# Patient Record
Sex: Male | Born: 2005 | Race: White | Hispanic: No | Marital: Single | Smoking: Never smoker
Health system: Southern US, Community
[De-identification: ages and names within clinical notes are randomized; demographics above are authoritative.]

## PROBLEM LIST (undated history)

## (undated) DIAGNOSIS — R4689 Other symptoms and signs involving appearance and behavior: Secondary | ICD-10-CM

## (undated) DIAGNOSIS — F913 Oppositional defiant disorder: Secondary | ICD-10-CM

## (undated) DIAGNOSIS — J302 Other seasonal allergic rhinitis: Secondary | ICD-10-CM

## (undated) DIAGNOSIS — L509 Urticaria, unspecified: Secondary | ICD-10-CM

## (undated) DIAGNOSIS — IMO0002 Reserved for concepts with insufficient information to code with codable children: Secondary | ICD-10-CM

## (undated) DIAGNOSIS — F909 Attention-deficit hyperactivity disorder, unspecified type: Secondary | ICD-10-CM

## (undated) DIAGNOSIS — J309 Allergic rhinitis, unspecified: Secondary | ICD-10-CM

## (undated) DIAGNOSIS — F980 Enuresis not due to a substance or known physiological condition: Secondary | ICD-10-CM

## (undated) DIAGNOSIS — J45909 Unspecified asthma, uncomplicated: Secondary | ICD-10-CM

## (undated) DIAGNOSIS — T7840XA Allergy, unspecified, initial encounter: Secondary | ICD-10-CM

## (undated) HISTORY — DX: Other seasonal allergic rhinitis: J30.2

## (undated) HISTORY — DX: Other symptoms and signs involving appearance and behavior: R46.89

## (undated) HISTORY — DX: Attention-deficit hyperactivity disorder, unspecified type: F90.9

## (undated) HISTORY — DX: Oppositional defiant disorder: F91.3

## (undated) HISTORY — DX: Allergy, unspecified, initial encounter: T78.40XA

## (undated) HISTORY — DX: Unspecified asthma, uncomplicated: J45.909

## (undated) HISTORY — PX: NO PAST SURGERIES: SHX2092

## (undated) HISTORY — DX: Reserved for concepts with insufficient information to code with codable children: IMO0002

## (undated) HISTORY — DX: Enuresis not due to a substance or known physiological condition: F98.0

## (undated) HISTORY — DX: Urticaria, unspecified: L50.9

## (undated) HISTORY — DX: Allergic rhinitis, unspecified: J30.9

---
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
## 2024-05-26 ENCOUNTER — Emergency Department: Payer: Self-pay

## 2024-05-26 ENCOUNTER — Other Ambulatory Visit: Payer: Self-pay

## 2024-05-26 ENCOUNTER — Emergency Department
Admission: EM | Admit: 2024-05-26 | Discharge: 2024-05-26 | Disposition: A | Discharge status: Home / Self Care | Payer: Self-pay | Attending: Emergency Medicine | Admitting: Emergency Medicine

## 2024-05-26 ENCOUNTER — Encounter: Payer: Self-pay | Admitting: Emergency Medicine

## 2024-05-26 DIAGNOSIS — R55 Syncope and collapse: Secondary | ICD-10-CM | POA: Insufficient documentation

## 2024-05-26 DIAGNOSIS — R0789 Other chest pain: Secondary | ICD-10-CM | POA: Insufficient documentation

## 2024-05-26 DIAGNOSIS — R079 Chest pain, unspecified: Secondary | ICD-10-CM

## 2024-05-26 DIAGNOSIS — J45909 Unspecified asthma, uncomplicated: Secondary | ICD-10-CM | POA: Insufficient documentation

## 2024-05-26 DIAGNOSIS — R110 Nausea: Secondary | ICD-10-CM | POA: Insufficient documentation

## 2024-05-26 DIAGNOSIS — R42 Dizziness and giddiness: Secondary | ICD-10-CM | POA: Insufficient documentation

## 2024-05-26 LAB — BASIC METABOLIC PANEL WITH GFR
Anion gap: 14 (ref 5–15)
BUN: 11 mg/dL (ref 6–20)
CO2: 23 mmol/L (ref 22–32)
Calcium: 9.7 mg/dL (ref 8.9–10.3)
Chloride: 102 mmol/L (ref 98–111)
Creatinine, Ser: 0.78 mg/dL (ref 0.61–1.24)
GFR, Estimated: >60 mL/min (ref >60)
Glucose, Bld: 89 mg/dL (ref 70–99)
Potassium: 4 mmol/L (ref 3.5–5.1)
Sodium: 139 mmol/L (ref 135–145)

## 2024-05-26 LAB — CBC
HCT: 45.1 % (ref 39.0–52.0)
Hemoglobin: 14.8 g/dL (ref 13.0–17.0)
MCH: 29 pg (ref 26.0–34.0)
MCHC: 32.8 g/dL (ref 30.0–36.0)
MCV: 88.4 fL (ref 80.0–100.0)
Platelets: 222 K/uL (ref 150–400)
RBC: 5.1 MIL/uL (ref 4.22–5.81)
RDW: 12.3 % (ref 11.5–15.5)
WBC: 6.2 K/uL (ref 4.0–10.5)
nRBC: 0 % (ref 0.0–0.2)

## 2024-05-26 LAB — TROPONIN T, HIGH SENSITIVITY: Troponin T High Sensitivity: <15 ng/L (ref 0–19)

## 2024-05-26 MED ORDER — HYDROXYZINE HCL 25 MG PO TABS
25.0000 mg | ORAL_TABLET | Freq: Once | ORAL | Status: AC
  Administered 2024-05-26: 25 mg via ORAL
  Filled 2024-05-26: qty 1

## 2024-05-26 MED ORDER — HYDROXYZINE HCL 25 MG PO TABS: 25.0000 mg | ORAL_TABLET | Freq: Two times a day (BID) | ORAL | 1 refills | Status: AC | PRN

## 2024-05-26 NOTE — Discharge Instructions (Addendum)
 Please drink fluids over next over days.  Please take your prescribed hydroxyzine  if needed, but only as prescribed.  Do not drink alcohol or drive while taking this medication.  Please follow-up with your doctor regarding today's ER visit.

## 2024-05-26 NOTE — ED Provider Notes (Signed)
 [facility] Provider Note    Event Date/Time   First MD Initiated Contact with Patient 05/26/24 1823     (approximate)  History   Chief Complaint: Chest Pain  HPI  Ivan Manning is a 18 y.o. male with a past medical history of ADHD, asthma, presents to the emergency department for intermittent chest discomfort and lightheadedness.  According to the patient for the past week or 2 he has been experiencing some intermittent chest pain with episodes of nausea and near syncope.  He states at times it feels like his heart is racing or that he might pass out.  Patient states he has been under a lot of stress recently due to his job and family illness.  Patient has a history of anxiety but states it has been better controlled recently.  Physical Exam   Triage Vital Signs: ED Triage Vitals  Encounter Vitals Group     BP 05/26/24 1747 (!) 119/91     Girls Systolic BP Percentile --      Girls Diastolic BP Percentile --      Boys Systolic BP Percentile --      Boys Diastolic BP Percentile --      Pulse Rate 05/26/24 1747 71     Resp 05/26/24 1747 18     Temp 05/26/24 1747 97.7 F (36.5 C)     Temp Source 05/26/24 1747 Oral     SpO2 05/26/24 1747 100 %     Weight 05/26/24 1748 140 lb (63.5 kg)     Height 05/26/24 1748 5' 9 (1.753 m)     Head Circumference --      Peak Flow --      Pain Score 05/26/24 1755 5     Pain Loc --      Pain Education --      Exclude from Growth Chart --     Most recent vital signs: Vitals:   05/26/24 1747  BP: (!) 119/91  Pulse: 71  Resp: 18  Temp: 97.7 F (36.5 C)  SpO2: 100%    General: Awake, no distress.  CV:  Good peripheral perfusion.  Regular rate and rhythm  Resp:  Normal effort.  Equal breath sounds bilaterally.  Abd:  No distention.  Soft, nontender.  No rebound or guarding. Other:  Somewhat anxious appearing otherwise reassuring physical exam.   ED Results / Procedures / Treatments   EKG  EKG  viewed and interpreted by myself shows a normal sinus rhythm at 64 bpm with a narrow QRS, normal axis, normal intervals, nonspecific but no concerning ST changes.  RADIOLOGY  I have reviewed interpret the chest x-ray images.  Patient has significant scoliosis but no consolidation seen on my evaluation. Radiology is read the x-ray is negative.   MEDICATIONS ORDERED IN ED: Medications  hydrOXYzine  (ATARAX ) tablet 25 mg (has no administration in time range)     IMPRESSION / MDM / ASSESSMENT AND PLAN / ED COURSE  I reviewed the triage vital signs and the nursing notes.  Patient's presentation is most consistent with acute presentation with potential threat to life or bodily function.  Patient presents to the emergency department with complaints of intermittent chest discomfort nausea and lightheadedness over the last couple weeks.  Patient states he has been under significant amount of stress recently.  Patient's vital signs are reassuring.  Physical exam is reassuring.  Lab work has resulted showing a normal CBC normal chemistry negative troponin and reassuring chest x-ray.  Given the patient's reassuring workup reassuring vitals and reassuring exam I believe the patient safe for discharge home to follow-up with his doctor.  Patient's symptoms seem suggestive of possible anxiety/stress response.  Discussed with patient a trial of hydroxyzine  to be used if needed and have the patient follow-up with his doctor.  Patient is agreeable to this plan as well.  FINAL CLINICAL IMPRESSION(S) / ED DIAGNOSES   Chest pain Near syncope   Note:  This document was prepared using Dragon voice recognition software and may include unintentional dictation errors.   Dorothyann Drivers, MD 05/26/24 [phone]

## 2024-05-26 NOTE — ED Triage Notes (Signed)
 Patient arrives ambulatory by POV c/o chest pain and light headed episodes over the past few weeks. Reports a near syncopal episode on Thursday with heart racing. Felt as if liquid in my chest.

## 2024-05-26 NOTE — ED Notes (Addendum)
 PT c/o angina, and states he had syncope episode Thursday. 1x episode of emesis yesterday. 3/10 chest pain and tightness, and lightheadedness. Denies smoking and drinking.

## 2024-05-26 NOTE — ED Notes (Signed)
 ED Provider at bedside.
